# Patient Record
Sex: Male | Born: 1960 | Race: White | Hispanic: No | Smoking: Never smoker
Health system: Southern US, Community
[De-identification: ages and names within clinical notes are randomized; demographics above are authoritative.]

## PROBLEM LIST (undated history)

## (undated) DIAGNOSIS — I4891 Unspecified atrial fibrillation: Secondary | ICD-10-CM

## (undated) HISTORY — PX: CARDIAC SURGERY: SHX584

---

## 2005-11-15 ENCOUNTER — Ambulatory Visit: Payer: Self-pay | Admitting: Family Medicine

## 2005-12-02 ENCOUNTER — Other Ambulatory Visit: Payer: Self-pay

## 2005-12-02 ENCOUNTER — Emergency Department: Payer: Self-pay | Admitting: Emergency Medicine

## 2012-10-31 ENCOUNTER — Emergency Department: Payer: Self-pay | Admitting: Emergency Medicine

## 2012-10-31 LAB — COMPREHENSIVE METABOLIC PANEL
Albumin: 3.8 g/dL (ref 3.4–5.0)
Alkaline Phosphatase: 79 U/L (ref 50–136)
BUN: 15 mg/dL (ref 7–18)
Chloride: 106 mmol/L (ref 98–107)
Creatinine: 1 mg/dL (ref 0.60–1.30)
EGFR (Non-African Amer.): >60
Glucose: 102 mg/dL — ABNORMAL HIGH (ref 65–99)
Osmolality: 280 (ref 275–301)
Potassium: 4.2 mmol/L (ref 3.5–5.1)
SGOT(AST): 35 U/L (ref 15–37)
SGPT (ALT): 66 U/L (ref 12–78)

## 2012-10-31 LAB — CK TOTAL AND CKMB (NOT AT ARMC)
CK, Total: 68 U/L (ref 35–232)
CK-MB: <0.5 ng/mL — ABNORMAL LOW (ref 0.5–3.6)

## 2012-10-31 LAB — PROTIME-INR
INR: 0.9
Prothrombin Time: 12.8 secs (ref 11.5–14.7)

## 2012-10-31 LAB — CBC
HGB: 14.7 g/dL (ref 13.0–18.0)
MCHC: 33 g/dL (ref 32.0–36.0)
Platelet: 226 10*3/uL (ref 150–440)
RBC: 5.35 10*6/uL (ref 4.40–5.90)

## 2012-10-31 LAB — TSH: Thyroid Stimulating Horm: 2.08 u[IU]/mL

## 2012-10-31 LAB — PRO B NATRIURETIC PEPTIDE: B-Type Natriuretic Peptide: 217 pg/mL — ABNORMAL HIGH (ref 0–125)

## 2012-10-31 LAB — MAGNESIUM: Magnesium: 1.7 mg/dL — ABNORMAL LOW

## 2012-11-10 ENCOUNTER — Ambulatory Visit: Payer: Self-pay | Admitting: Internal Medicine

## 2019-12-01 ENCOUNTER — Ambulatory Visit
Admission: EM | Admit: 2019-12-01 | Discharge: 2019-12-01 | Discharge status: Absent without leave | Payer: Self-pay | Attending: Emergency Medicine | Admitting: Emergency Medicine

## 2019-12-01 ENCOUNTER — Other Ambulatory Visit: Payer: Self-pay

## 2019-12-04 ENCOUNTER — Ambulatory Visit
Admission: EM | Admit: 2019-12-04 | Discharge: 2019-12-04 | Disposition: A | Discharge status: Home / Self Care | Payer: BC Managed Care – PPO | Attending: Family Medicine | Admitting: Family Medicine

## 2019-12-04 ENCOUNTER — Other Ambulatory Visit: Payer: Self-pay

## 2019-12-04 DIAGNOSIS — Z20822 Contact with and (suspected) exposure to covid-19: Secondary | ICD-10-CM

## 2019-12-04 HISTORY — DX: Unspecified atrial fibrillation: I48.91

## 2019-12-04 NOTE — ED Triage Notes (Signed)
Patient states that he was exposed to Covid last weekend and would like to be tested. Patient currently without symptoms.

## 2019-12-04 NOTE — ED Provider Notes (Signed)
[facility] ____________________________________________  Time seen: Approximately 9:49 AM  I have reviewed the triage vital signs and the nursing notes.   HISTORY  Chief Complaint covid exposure   HPI Antonio Curry is a 59 y.o. male presenting for Covid 19 testing. Reports he and his wife were exposed to someone who tested positive for COVID-19, last exposure 1 week ago. Denies symptoms. States feels well. Denies cough, congestion, sore throat, fever, vomiting, diarrhea, chest pain, shortness of breath, changes in taste or smell. Denies aggravating or alleviating factors.  Past Medical History:  Diagnosis Date  . Atrial fibrillation (HCC)     There are no problems to display for this patient.   History reviewed. No pertinent surgical history.   No current facility-administered medications for this encounter.  Current Outpatient Medications:  .  aspirin 325 MG tablet, Take by mouth., Disp: , Rfl:  .  flecainide (TAMBOCOR) 100 MG tablet, Take 100 mg by mouth 2 (two) times daily., Disp: , Rfl:  .  metoprolol tartrate (LOPRESSOR) 25 MG tablet, Take 25 mg by mouth 2 (two) times daily., Disp: , Rfl:   Allergies Patient has no known allergies.  History reviewed. No pertinent family history.  Social History Social History   Tobacco Use  . Smoking status: Never Smoker  . Smokeless tobacco: Never Used  Substance Use Topics  . Alcohol use: Yes    Comment: occasionally  . Drug use: Never    Review of Systems Constitutional: No fever ENT: No sore throat. Cardiovascular: Denies chest pain. Respiratory: Denies shortness of breath. Gastrointestinal: No abdominal pain.  No nausea, no vomiting.  No diarrhea.  Musculoskeletal: Negative for back pain. Skin: Negative for rash.   ____________________________________________   PHYSICAL EXAM:  VITAL SIGNS: ED Triage Vitals  Enc Vitals Group     BP 12/04/19 0913 130/68     Pulse Rate 12/04/19 0913 (!) 52      Resp 12/04/19 0913 16     Temp 12/04/19 0913 98 F (36.7 C)     Temp Source 12/04/19 0913 Oral     SpO2 12/04/19 0913 100 %     Weight 12/04/19 0911 280 lb (127 kg)     Height 12/04/19 0911 6\' 4"  (1.93 m)     Head Circumference --      Peak Flow --      Pain Score 12/04/19 0911 0     Pain Loc --      Pain Edu? --      Excl. in GC? --     Constitutional: Alert and oriented. Well appearing and in no acute distress. Eyes: Conjunctivae are normal.  ENT      Head: Normocephalic and atraumatic. Cardiovascular: Normal rate, regular rhythm. Grossly normal heart sounds.  Good peripheral circulation. Respiratory: Normal respiratory effort without tachypnea nor retractions. Breath sounds are clear and equal bilaterally. No wheezes, rales, rhonchi. Musculoskeletal:  Steady gait.  Neurologic:  Normal speech and language.Speech is normal. No gait instability.  Skin:  Skin is warm, dry and intact. No rash noted. Psychiatric: Mood and affect are normal. Speech and behavior are normal. Patient exhibits appropriate insight and judgment   ___________________________________________   LABS (all labs ordered are listed, but only abnormal results are displayed)  Labs Reviewed  NOVEL CORONAVIRUS, NAA (HOSP ORDER, SEND-OUT TO REF LAB; TAT 18-24 HRS)     PROCEDURES Procedures    INITIAL IMPRESSION / ASSESSMENT AND PLAN / ED COURSE  Pertinent labs & imaging results  that were available during my care of the patient were reviewed by me and considered in my medical decision making (see chart for details).  Well-appearing patient. No acute distress. Asymptomatic. COVID-19 testing completed advice given. Monitor. Discussed follow up and return parameters including no resolution or any worsening concerns. Patient verbalized understanding and agreed to plan.   ____________________________________________   FINAL CLINICAL IMPRESSION(S) / ED DIAGNOSES  Final diagnoses:  Encounter for screening  laboratory testing for COVID-19 virus  Exposure to COVID-19 virus     ED Discharge Orders    None       Note: This dictation was prepared with Dragon dictation along with smaller phrase technology. Any transcriptional errors that result from this process are unintentional.         Marylene Land, NP 12/04/19 [phone]

## 2019-12-05 LAB — NOVEL CORONAVIRUS, NAA (HOSP ORDER, SEND-OUT TO REF LAB; TAT 18-24 HRS): SARS-CoV-2, NAA: NOT DETECTED

## 2020-09-15 ENCOUNTER — Ambulatory Visit
Admission: EM | Admit: 2020-09-15 | Discharge: 2020-09-15 | Disposition: A | Discharge status: Home / Self Care | Payer: BC Managed Care – PPO | Attending: Family Medicine | Admitting: Family Medicine

## 2020-09-15 ENCOUNTER — Ambulatory Visit (INDEPENDENT_AMBULATORY_CARE_PROVIDER_SITE_OTHER): Payer: BC Managed Care – PPO

## 2020-09-15 ENCOUNTER — Other Ambulatory Visit: Payer: Self-pay

## 2020-09-15 DIAGNOSIS — Z7982 Long term (current) use of aspirin: Secondary | ICD-10-CM | POA: Insufficient documentation

## 2020-09-15 DIAGNOSIS — R509 Fever, unspecified: Secondary | ICD-10-CM

## 2020-09-15 DIAGNOSIS — R059 Cough, unspecified: Secondary | ICD-10-CM

## 2020-09-15 DIAGNOSIS — I4891 Unspecified atrial fibrillation: Secondary | ICD-10-CM | POA: Insufficient documentation

## 2020-09-15 DIAGNOSIS — R051 Acute cough: Secondary | ICD-10-CM | POA: Diagnosis present

## 2020-09-15 DIAGNOSIS — J180 Bronchopneumonia, unspecified organism: Secondary | ICD-10-CM | POA: Diagnosis not present

## 2020-09-15 DIAGNOSIS — Z79899 Other long term (current) drug therapy: Secondary | ICD-10-CM | POA: Diagnosis not present

## 2020-09-15 DIAGNOSIS — U071 COVID-19: Secondary | ICD-10-CM | POA: Diagnosis not present

## 2020-09-15 LAB — SARS CORONAVIRUS 2 (TAT 6-24 HRS): SARS Coronavirus 2: POSITIVE — AB

## 2020-09-15 MED ORDER — LEVALBUTEROL TARTRATE 45 MCG/ACT IN AERO: 2.0000 | INHALATION_SPRAY | Freq: Four times a day (QID) | RESPIRATORY_TRACT | 12 refills | Status: AC | PRN

## 2020-09-15 MED ORDER — AEROCHAMBER MV MISC: 2 refills | Status: AC

## 2020-09-15 MED ORDER — DOXYCYCLINE HYCLATE 100 MG PO CAPS: 100.0000 mg | ORAL_CAPSULE | Freq: Two times a day (BID) | ORAL | 0 refills | Status: AC

## 2020-09-15 NOTE — ED Triage Notes (Signed)
Pt with mild cough, headache and bodyaches starting on Tuesday night.

## 2020-09-15 NOTE — ED Provider Notes (Signed)
[facility]    CSN: [id number] Arrival date & time: 09/15/20  0902      History   Chief Complaint Chief Complaint  Patient presents with   Headache   Generalized Body Aches    HPI Antonio Curry is a 59 y.o. male.   59 year old male here for evaluation of cough, headache, body aches.  Patient reports that he has had symptoms for the past 3 days.  He states that he has had a low-grade fever up to 100 that has maintained.  He reports that his sputum production is brown and intermittent.  Patient denies sick contacts, shortness of breath, sinus pressure, runny nose, ear pressure, GI complaints, changes to taste or smell, and reports he has a good appetite.  Patient denies wheezing.  Patient has not received his flu shot or his Covid vaccine.  Patient has a history of atrial fibrillation is on flecainide.  Patient is cardiologist are concerned that patient may have an adverse cardiac reaction if he gets the Covid vaccine and that is why he has not been vaccinated at present.     Past Medical History:  Diagnosis Date   Atrial fibrillation (HCC)     There are no problems to display for this patient.   History reviewed. No pertinent surgical history.     Home Medications    Prior to Admission medications   Medication Sig Start Date End Date Taking? Authorizing Provider  aspirin 325 MG tablet Take by mouth.    [provider]  doxycycline (VIBRAMYCIN) 100 MG capsule Take 1 capsule (100 mg total) by mouth 2 (two) times daily. 09/15/20   Becky Augusta, NP  flecainide (TAMBOCOR) 100 MG tablet Take 100 mg by mouth 2 (two) times daily. 11/23/19   [provider]  levalbuterol Pauline Aus HFA) 45 MCG/ACT inhaler Inhale 2 puffs into the lungs every 6 (six) hours as needed for wheezing. 09/15/20   Becky Augusta, NP  metoprolol tartrate (LOPRESSOR) 25 MG tablet Take 25 mg by mouth 2 (two) times daily. 11/23/19   [provider]    Spacer/Aero-Holding Chambers (AEROCHAMBER MV) inhaler Use as instructed 09/15/20   Becky Augusta, NP    Family History History reviewed. No pertinent family history.  Social History Social History   Tobacco Use   Smoking status: Never Smoker   Smokeless tobacco: Never Used  Building services engineer Use: Never used  Substance Use Topics   Alcohol use: Yes    Comment: occasionally   Drug use: Never     Allergies   Patient has no known allergies.   Review of Systems Review of Systems  Constitutional: Positive for fever. Negative for activity change and appetite change.  HENT: Negative for congestion, ear pain, rhinorrhea, sinus pressure and sore throat.   Respiratory: Positive for cough. Negative for shortness of breath and wheezing.   Cardiovascular: Negative for chest pain.  Gastrointestinal: Negative for diarrhea, nausea and vomiting.  Musculoskeletal: Positive for arthralgias and myalgias.  Skin: Negative for rash.  Neurological: Positive for headaches. Negative for dizziness and syncope.  Psychiatric/Behavioral: Negative.      Physical Exam Triage Vital Signs ED Triage Vitals  Enc Vitals Group     BP 09/15/20 0923 125/77     Pulse Rate 09/15/20 0923 65     Resp 09/15/20 0923 18     Temp 09/15/20 0923 98.9 F (37.2 C)     Temp Source 09/15/20 0923 Oral     SpO2 09/15/20  0923 97 %     Weight 09/15/20 0921 280 lb (127 kg)     Height 09/15/20 0921 6\' 4"  (1.93 m)     Head Circumference --      Peak Flow --      Pain Score 09/15/20 0921 8     Pain Loc --      Pain Edu? --      Excl. in GC? --    No data found.  Updated Vital Signs BP 125/77 (BP Location: Right Arm)    Pulse 65    Temp 98.9 F (37.2 C) (Oral)    Resp 18    Ht 6\' 4"  (1.93 m)    Wt 280 lb (127 kg)    SpO2 97%    BMI 34.08 kg/m   Visual Acuity Right Eye Distance:   Left Eye Distance:   Bilateral Distance:    Right Eye Near:   Left Eye Near:    Bilateral Near:     Physical  Exam Vitals and nursing note reviewed.  Constitutional:      General: He is not in acute distress.    Appearance: He is well-developed. He is not toxic-appearing.  HENT:     Head: Normocephalic and atraumatic.     Comments: Lateral EACs and tympanic membranes are clear.  TMs are pearly gray with a good light reflex.  Sinuses are nontender to percussion.  Nasal mucosa is pink and moist without erythema or edema and no nasal discharge.    Mouth/Throat:     Mouth: Mucous membranes are moist.     Pharynx: Oropharynx is clear.  Eyes:     General: No scleral icterus.    Extraocular Movements: Extraocular movements intact.     Right eye: No nystagmus.     Left eye: No nystagmus.     Pupils: Pupils are equal, round, and reactive to light.  Cardiovascular:     Rate and Rhythm: Normal rate and regular rhythm.     Heart sounds: Normal heart sounds. No murmur heard.  No gallop.   Pulmonary:     Effort: Pulmonary effort is normal.     Breath sounds: Wheezing and rales present. No rhonchi.     Comments: Patient has wheezes throughout all lung fields.  Patient also has fine rales in the left lower lobe posteriorly. Musculoskeletal:        General: No swelling or tenderness. Normal range of motion.     Cervical back: Normal range of motion and neck supple.  Lymphadenopathy:     Cervical: No cervical adenopathy.  Skin:    General: Skin is warm and dry.     Capillary Refill: Capillary refill takes less than 2 seconds.     Findings: No erythema or rash.  Neurological:     Mental Status: He is alert and oriented to person, place, and time.     GCS: GCS eye subscore is 4. GCS verbal subscore is 5. GCS motor subscore is 6.     Motor: No weakness.  Psychiatric:        Mood and Affect: Mood normal.        Speech: Speech normal.        Behavior: Behavior normal.      UC Treatments / Results  Labs (all labs ordered are listed, but only abnormal results are displayed) Labs Reviewed  SARS  CORONAVIRUS 2 (TAT 6-24 HRS)    EKG   Radiology DG Chest 2 View  Result  Date: 09/15/2020 CLINICAL DATA:  Cough, fever EXAM: CHEST - 2 VIEW COMPARISON:  None. FINDINGS: The heart size and mediastinal contours are within normal limits. No focal airspace consolidation, pleural effusion, or pneumothorax. Degenerative changes present within the bilateral shoulders and thoracic spine. IMPRESSION: No active cardiopulmonary disease. Electronically Signed   By: Duanne Guess D.O.   On: 09/15/2020 10:04    Procedures Procedures (including critical care time)  Medications Ordered in UC Medications - No data to display  Initial Impression / Assessment and Plan / UC Course  I have reviewed the triage vital signs and the nursing notes.  Pertinent labs & imaging results that were available during my care of the patient were reviewed by me and considered in my medical decision making (see chart for details).   Patient is here for evaluation of a productive cough that he has had for 3 days.  He denies wheezing or URI symptoms.  No shortness of breath.  On exam patient does have wheezing in all of his lung fields and fine rales in the left lower lobe posteriorly.  Patient states he has had a low-grade fever up to 100.  He has intermittent productive cough for brown sputum.  Patient is not a smoker.  Physical exam and symptoms are suspicious for pneumonia.  Will obtain chest x-ray.  Chest x-ray independently evaluated by me.  There is what appears to be a patchy infiltrate and increased pulmonary vascular markings in the left lower lobe.  Radiology is reading it as negative cardiothoracic process.  Based on intermittent fever, and productive cough we will treat patient for bronchopneumonia with doxycycline twice daily for 10 days.  We will also give patient an inhaler to help him with his wheezing.  Given patient's history of atrial fibrillation and taking flecainide will prescribe of albuterol instead  of albuterol.   Final Clinical Impressions(s) / UC Diagnoses   Final diagnoses:  Bronchopneumonia     Discharge Instructions     Take the doxycycline twice daily with food for 10 days.  Use the Xopenex inhaler, 2 puffs every 6 hours, as needed for cough and wheezing.  Increase your oral fluid intake to keep your mucus thin.  You can use plain over-the-counter Delsym cough to help alleviate your cough symptoms or a honey-based cough syrup such as Zarbee's.  If your symptoms continue follow-up with your primary care provider.      ED Prescriptions    Medication Sig Dispense Auth. Provider   doxycycline (VIBRAMYCIN) 100 MG capsule Take 1 capsule (100 mg total) by mouth 2 (two) times daily. 20 capsule Becky Augusta, NP   levalbuterol [facility] HFA) 45 MCG/ACT inhaler Inhale 2 puffs into the lungs every 6 (six) hours as needed for wheezing. 1 each Becky Augusta, NP   Spacer/Aero-Holding Deretha Emory (AEROCHAMBER MV) inhaler Use as instructed 1 each Becky Augusta, NP     PDMP not reviewed this encounter.   Becky Augusta, NP 09/15/20 1046

## 2020-09-15 NOTE — Discharge Instructions (Addendum)
Take the doxycycline twice daily with food for 10 days.  Use the Xopenex inhaler, 2 puffs every 6 hours, as needed for cough and wheezing.  Increase your oral fluid intake to keep your mucus thin.  You can use plain over-the-counter Delsym cough to help alleviate your cough symptoms or a honey-based cough syrup such as Zarbee's.  If your symptoms continue follow-up with your primary care provider.

## 2020-09-16 ENCOUNTER — Telehealth: Payer: Self-pay | Admitting: Nurse Practitioner

## 2020-09-16 NOTE — Telephone Encounter (Signed)
Called to Discuss with patient about Covid symptoms and the use of the monoclonal antibody infusion for those with mild to moderate Covid symptoms and at a high risk of hospitalization.     Pt appears to qualify for this infusion due to co-morbid conditions and/or a member of an at-risk group in accordance with the FDA Emergency Use Authorization. (A-fib, BMI >25).   Patient would like to be scheduled at [facility]. I have spoken with Marcelino Duster @ [place] [facility] who will call patient to schedule. Patient is aware to expect a phone call.   Willette Alma, NP [facility]  [phone]

## 2020-10-21 ENCOUNTER — Ambulatory Visit: Payer: BC Managed Care – PPO | Admitting: Cardiovascular Disease

## 2022-02-04 ENCOUNTER — Other Ambulatory Visit: Payer: Self-pay

## 2022-02-04 ENCOUNTER — Ambulatory Visit
Admission: EM | Admit: 2022-02-04 | Discharge: 2022-02-04 | Disposition: A | Discharge status: Home / Self Care | Payer: BC Managed Care – PPO | Attending: Physician Assistant | Admitting: Physician Assistant

## 2022-02-04 ENCOUNTER — Encounter: Payer: Self-pay | Admitting: Emergency Medicine

## 2022-02-04 ENCOUNTER — Ambulatory Visit (INDEPENDENT_AMBULATORY_CARE_PROVIDER_SITE_OTHER): Payer: BC Managed Care – PPO

## 2022-02-04 DIAGNOSIS — S61012A Laceration without foreign body of left thumb without damage to nail, initial encounter: Secondary | ICD-10-CM

## 2022-02-04 DIAGNOSIS — S61319A Laceration without foreign body of unspecified finger with damage to nail, initial encounter: Secondary | ICD-10-CM

## 2022-02-04 DIAGNOSIS — S62525B Nondisplaced fracture of distal phalanx of left thumb, initial encounter for open fracture: Secondary | ICD-10-CM

## 2022-02-04 NOTE — Discharge Instructions (Signed)
-  You have a fracture of the distal aspect of your thumb.  Since you have an overlying laceration this is considered an open fracture.  It has a high risk of getting infected.  You need to see orthopedics right away especially since this happened yesterday.  Advised going to [facility] walk-in urgent care in [place] at this time.  They close at 9 PM tonight.  I am sure they will have to do a surgical procedure on your thumb. ? ?[facility] ?[address] [place], [place] [address] ?Phone: [phone] ? ?

## 2022-02-04 NOTE — ED Triage Notes (Signed)
Patient states that he got his left thumb caught in the cabinet door when he went to close the door.  This injury happened around 10pm last night.  Patient c/o pain in his left thumb.  ?

## 2022-02-04 NOTE — ED Provider Notes (Signed)
?[facility] ? ? ? ?CSN: [id number] ?Arrival date & time: 02/04/22  0956 ? ? ?  ? ?History   ?Chief Complaint ?Chief Complaint  ?Patient presents with  ? Extremity Laceration  ?  Left thumb  ? ? ?HPI ?Antonio Curry is a 61 y.o. male presenting for left thumb nail laceration and pain following an accident that occurred last night about 10 PM.  Patient says his thumb got caught in a cabinet door when he went to close it.  He says he cleaned it with hydrogen peroxide.  He says he is up-to-date with his tetanus.  He denies any associated numbness and has full range of motion of his thumb.  He has not taking thing for pain.  No active bleeding.  He has had it bandaged.  No other complaints. ? ?HPI ? ?Past Medical History:  ?Diagnosis Date  ? Atrial fibrillation (HCC)   ? ? ?There are no problems to display for this patient. ? ? ?Past Surgical History:  ?Procedure Laterality Date  ? CARDIAC SURGERY    ? ? ? ? ? ?Home Medications   ? ?Prior to Admission medications   ?Medication Sig Start Date End Date Taking? Authorizing Provider  ?aspirin 325 MG tablet Take by mouth.   Yes [provider]  ?doxycycline (VIBRAMYCIN) 100 MG capsule Take 1 capsule (100 mg total) by mouth 2 (two) times daily. 09/15/20   Becky Augustayan, Jeremy, NP  ?flecainide (TAMBOCOR) 100 MG tablet Take 100 mg by mouth 2 (two) times daily. 11/23/19   [provider]  ?levalbuterol Pauline Aus(XOPENEX HFA) 45 MCG/ACT inhaler Inhale 2 puffs into the lungs every 6 (six) hours as needed for wheezing. 09/15/20   Becky Augustayan, Jeremy, NP  ?metoprolol tartrate (LOPRESSOR) 25 MG tablet Take 25 mg by mouth 2 (two) times daily. 11/23/19   [provider]  ?Spacer/Aero-Holding Chambers (AEROCHAMBER MV) inhaler Use as instructed 09/15/20   Becky Augustayan, Jeremy, NP  ? ? ?Family History ?History reviewed. No pertinent family history. ? ?Social History ?Social History  ? ?Tobacco Use  ? Smoking status: Never  ? Smokeless tobacco: Never  ?Vaping Use  ? Vaping Use: Never  used  ?Substance Use Topics  ? Alcohol use: Yes  ?  Comment: occasionally  ? Drug use: Never  ? ? ? ?Allergies   ?Patient has no known allergies. ? ? ?Review of Systems ?Review of Systems  ?Musculoskeletal:  Positive for arthralgias and joint swelling.  ?Skin:  Positive for wound.  ?Neurological:  Negative for weakness and numbness.  ? ? ?Physical Exam ?Triage Vital Signs ?ED Triage Vitals  ?Enc Vitals Group  ?   BP   ?   Pulse   ?   Resp   ?   Temp   ?   Temp src   ?   SpO2   ?   Weight   ?   Height   ?   Head Circumference   ?   Peak Flow   ?   Pain Score   ?   Pain Loc   ?   Pain Edu?   ?   Excl. in GC?   ? ?No data found. ? ?Updated Vital Signs ?BP 135/90 (BP Location: Left Arm)   Pulse 74   Temp 97.8 ?F (36.6 ?C) (Oral)   Resp 15   Ht 6\' 3"  (1.905 m)   Wt 278 lb (126.1 kg)   SpO2 100%   BMI 34.75 kg/m?  ?  ? ?  Physical Exam ?Vitals and nursing note reviewed.  ?Constitutional:   ?   General: He is not in acute distress. ?   Appearance: Normal appearance. He is well-developed. He is not ill-appearing.  ?HENT:  ?   Head: Normocephalic and atraumatic.  ?Eyes:  ?   General: No scleral icterus. ?   Conjunctiva/sclera: Conjunctivae normal.  ?Cardiovascular:  ?   Rate and Rhythm: Normal rate.  ?   Pulses: Normal pulses.  ?Pulmonary:  ?   Effort: Pulmonary effort is normal. No respiratory distress.  ?Musculoskeletal:  ?   Cervical back: Neck supple.  ?   Comments: Left thumb: There is a large oblique laceration of the thumbnail that goes all the way through the nail.  Dried blood noted.  He does have swelling of the distal thumb with exquisite tenderness over the distal phalanx.  Full range of motion of thumb.  Good pulses, strength and sensation.  ?Skin: ?   General: Skin is warm and dry.  ?   Capillary Refill: Capillary refill takes less than 2 seconds.  ?Neurological:  ?   General: No focal deficit present.  ?   Mental Status: He is alert. Mental status is at baseline.  ?   Motor: No weakness.  ?   Gait: Gait  normal.  ?Psychiatric:     ?   Mood and Affect: Mood normal.     ?   Behavior: Behavior normal.     ?   Thought Content: Thought content normal.  ? ? ? ?UC Treatments / Results  ?Labs ?(all labs ordered are listed, but only abnormal results are displayed) ?Labs Reviewed - No data to display ? ?EKG ? ? ?Radiology ?DG Finger Thumb Left ? ?Result Date: 02/04/2022 ?CLINICAL DATA:  Left thumb laceration. EXAM: LEFT THUMB 2+V COMPARISON:  None. FINDINGS: Three view study. Oblique fracture noted through the tuft of the distal phalanx. No subluxation or dislocation. No evidence for retained soft tissue foreign body. IMPRESSION: 1. Oblique fracture through the tuft of the distal phalanx. 2. No retained soft tissue foreign body. Electronically Signed   By: Kennith Center M.D.   On: 02/04/2022 11:25   ? ?Procedures ?Procedures (including critical care time) ? ?Medications Ordered in UC ?Medications - No data to display ? ?Initial Impression / Assessment and Plan / UC Course  ?I have reviewed the triage vital signs and the nursing notes. ? ?Pertinent labs & imaging results that were available during my care of the patient were reviewed by me and considered in my medical decision making (see chart for details). ? ?61 year old male presenting for left thumb injury.  Patient smashed in a cabinet door.  He has a laceration through his thumbnail.  X-ray obtained today.  X-ray shows oblique fracture through the tuft of the distal phalanx.  The laceration is overlying the fracture.  Advised him he has an open fracture which requires him to follow-up with orthopedics emergently.  I have advised him to go to [facility] at this time.  Suspect he will need to have a surgical procedure performed.  Patient was bandaged and left in stable condition. ? ? ?Final Clinical Impressions(s) / UC Diagnoses  ? ?Final diagnoses:  ?Open nondisplaced fracture of distal phalanx of left thumb, initial encounter  ?Laceration of nail bed of finger, initial  encounter  ? ? ? ?Discharge Instructions   ? ?  ?-You have a fracture of the distal aspect of your thumb.  Since you have an overlying laceration  this is considered an open fracture.  It has a high risk of getting infected.  You need to see orthopedics right away especially since this happened yesterday.  Advised going to [facility] walk-in urgent care in [place] at this time.  They close at 9 PM tonight.  I am sure they will have to do a surgical procedure on your thumb. ? ?[facility] ?[address], [place], [place] [address] ?Phone: [phone] ? ? ? ? ? ?ED Prescriptions   ?None ?  ? ?PDMP not reviewed this encounter. ?  ?Shirlee Latch, PA-C ?02/04/22 1133 ? ?

## 2023-12-16 ENCOUNTER — Other Ambulatory Visit: Payer: Self-pay | Admitting: Internal Medicine

## 2023-12-16 DIAGNOSIS — I480 Paroxysmal atrial fibrillation: Secondary | ICD-10-CM

## 2023-12-16 DIAGNOSIS — R0602 Shortness of breath: Secondary | ICD-10-CM

## 2023-12-16 DIAGNOSIS — R079 Chest pain, unspecified: Secondary | ICD-10-CM

## 2023-12-27 ENCOUNTER — Ambulatory Visit
Admission: RE | Admit: 2023-12-27 | Discharge: 2023-12-27 | Disposition: A | Discharge status: Home / Self Care | Payer: Self-pay | Source: Ambulatory Visit | Attending: Internal Medicine | Admitting: Internal Medicine

## 2023-12-27 DIAGNOSIS — R079 Chest pain, unspecified: Secondary | ICD-10-CM | POA: Insufficient documentation

## 2023-12-27 DIAGNOSIS — R0602 Shortness of breath: Secondary | ICD-10-CM | POA: Insufficient documentation

## 2023-12-27 DIAGNOSIS — I480 Paroxysmal atrial fibrillation: Secondary | ICD-10-CM | POA: Insufficient documentation

## 2024-02-22 IMAGING — CR DG FINGER THUMB 2+V*L*
3 series · 3 of 3 positions shown · non-contrast
Comparison: None.

CLINICAL DATA: Left thumb laceration.

EXAM:
LEFT THUMB 2+V

[finger ap (1 of 2)]
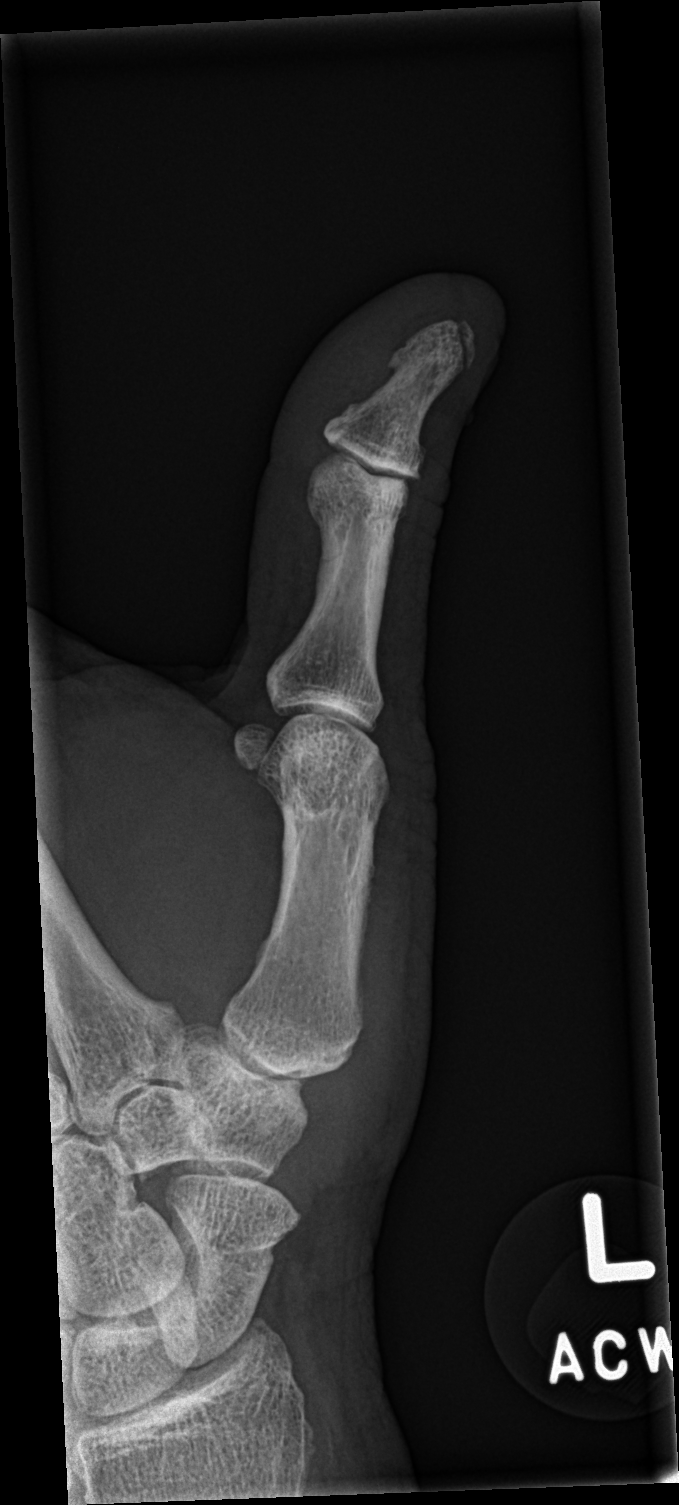

[finger lat]
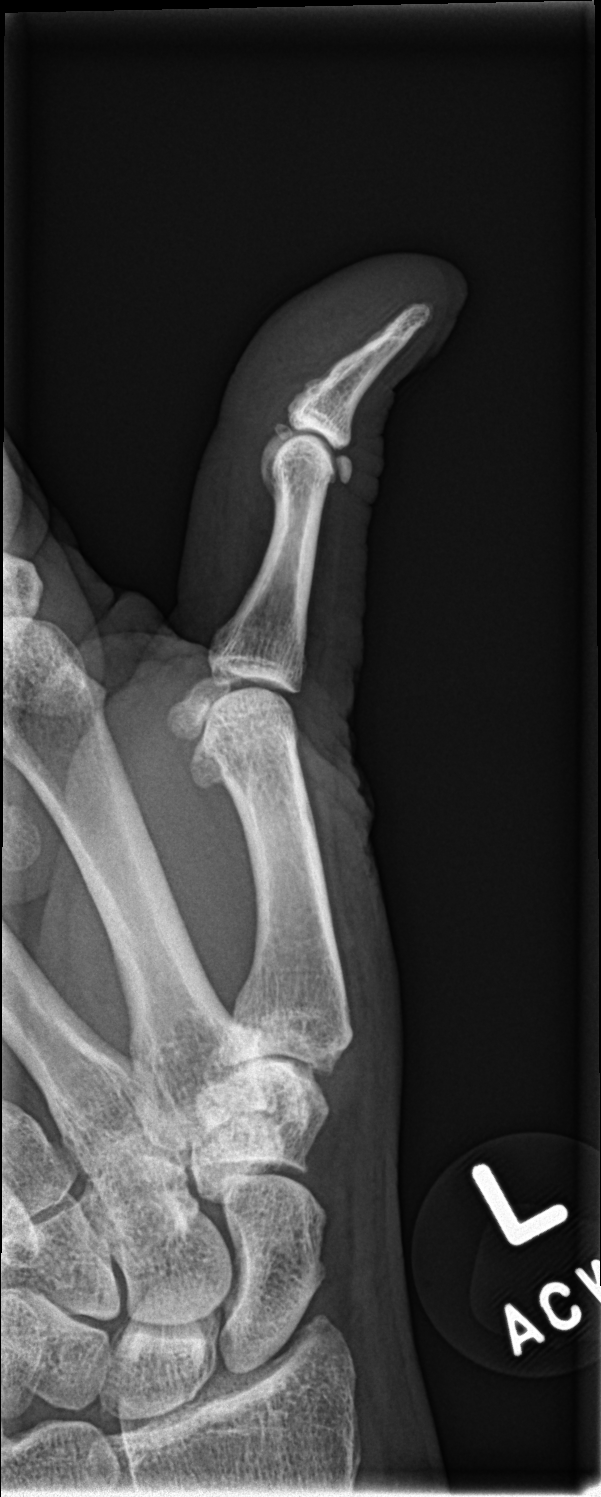

[finger ap (2 of 2)]
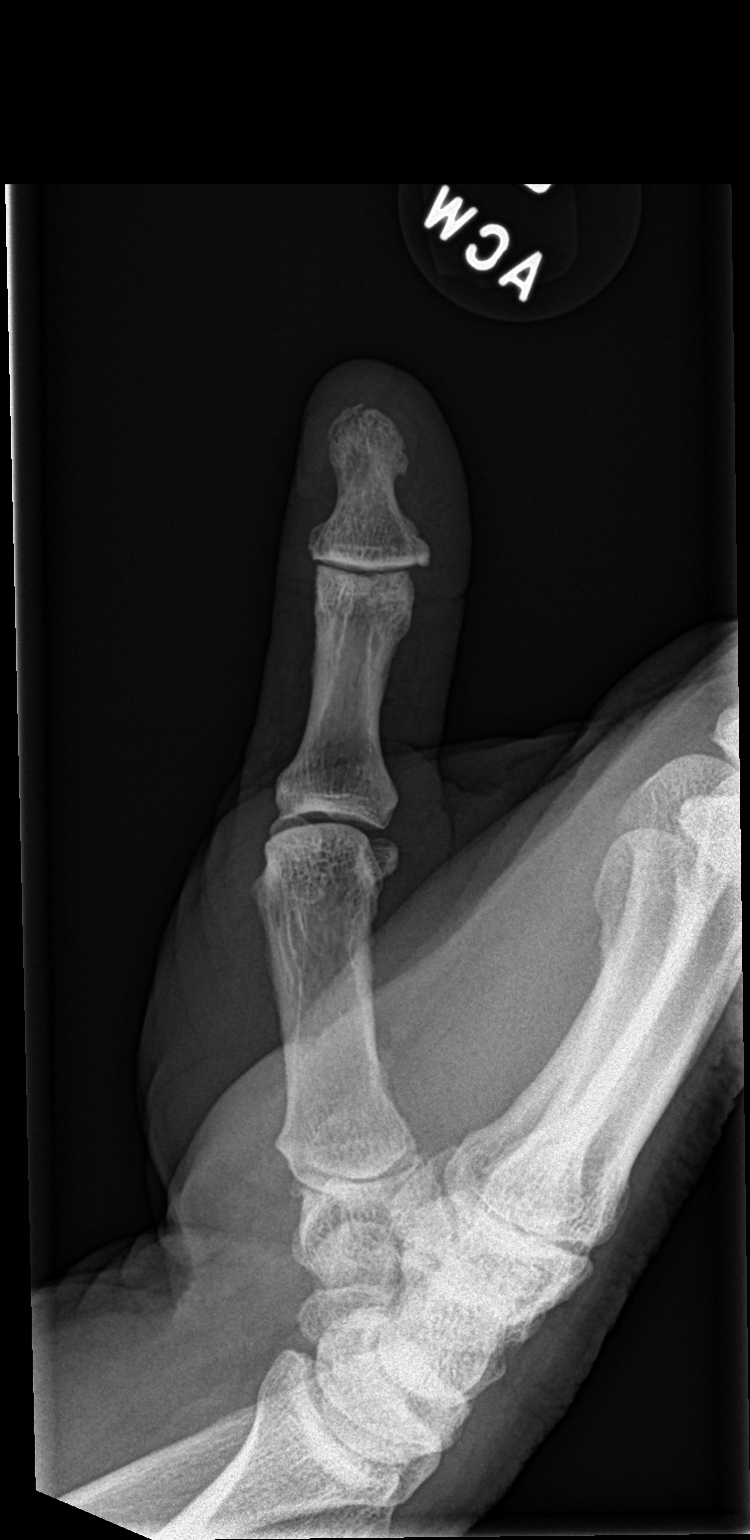

[3 of 3 positions shown; findings below may reference images not displayed]

FINDINGS: Three view study. Oblique fracture noted through the tuft of the
distal phalanx. No subluxation or dislocation. No evidence for
retained soft tissue foreign body.
IMPRESSION: 1. Oblique fracture through the tuft of the distal phalanx.
2. No retained soft tissue foreign body.
